# Patient Record
Sex: Male | Born: 1990 | Race: White | Hispanic: No | Marital: Single
Health system: Southern US, Community
[De-identification: ages and names within clinical notes are randomized; demographics above are authoritative.]

---
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
## 2010-08-08 ENCOUNTER — Emergency Department (HOSPITAL_COMMUNITY): Payer: BC Managed Care – PPO

## 2010-08-08 ENCOUNTER — Emergency Department (HOSPITAL_COMMUNITY)
Admission: EM | Admit: 2010-08-08 | Discharge: 2010-08-08 | Disposition: A | Discharge status: Home / Self Care | Payer: BC Managed Care – PPO | Attending: Emergency Medicine | Admitting: Emergency Medicine

## 2010-08-08 DIAGNOSIS — M25539 Pain in unspecified wrist: Secondary | ICD-10-CM | POA: Insufficient documentation

## 2010-08-08 DIAGNOSIS — M79609 Pain in unspecified limb: Secondary | ICD-10-CM | POA: Insufficient documentation

## 2011-08-20 IMAGING — CR DG HAND COMPLETE 3+V*L*
3 series · 3 of 3 positions shown · non-contrast
Comparison: None.

CLINICAL DATA: Fall.  Hand pain.

LEFT HAND - COMPLETE 3+ VIEW

[x hand ap left]
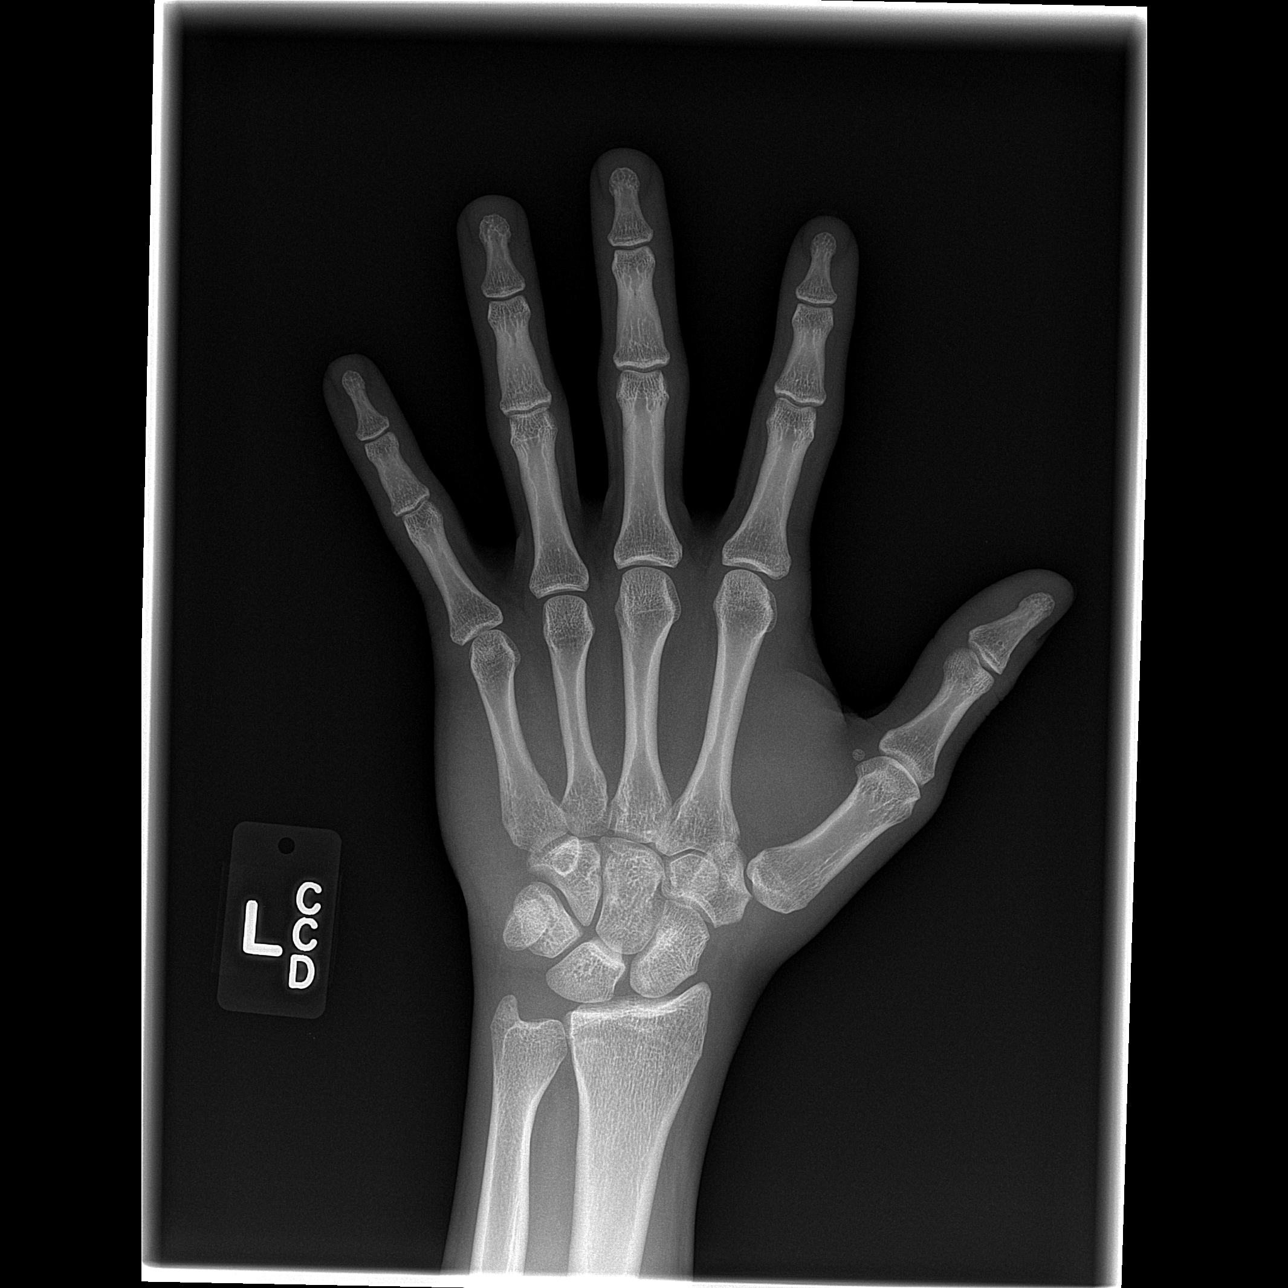

[x hand oblique left]
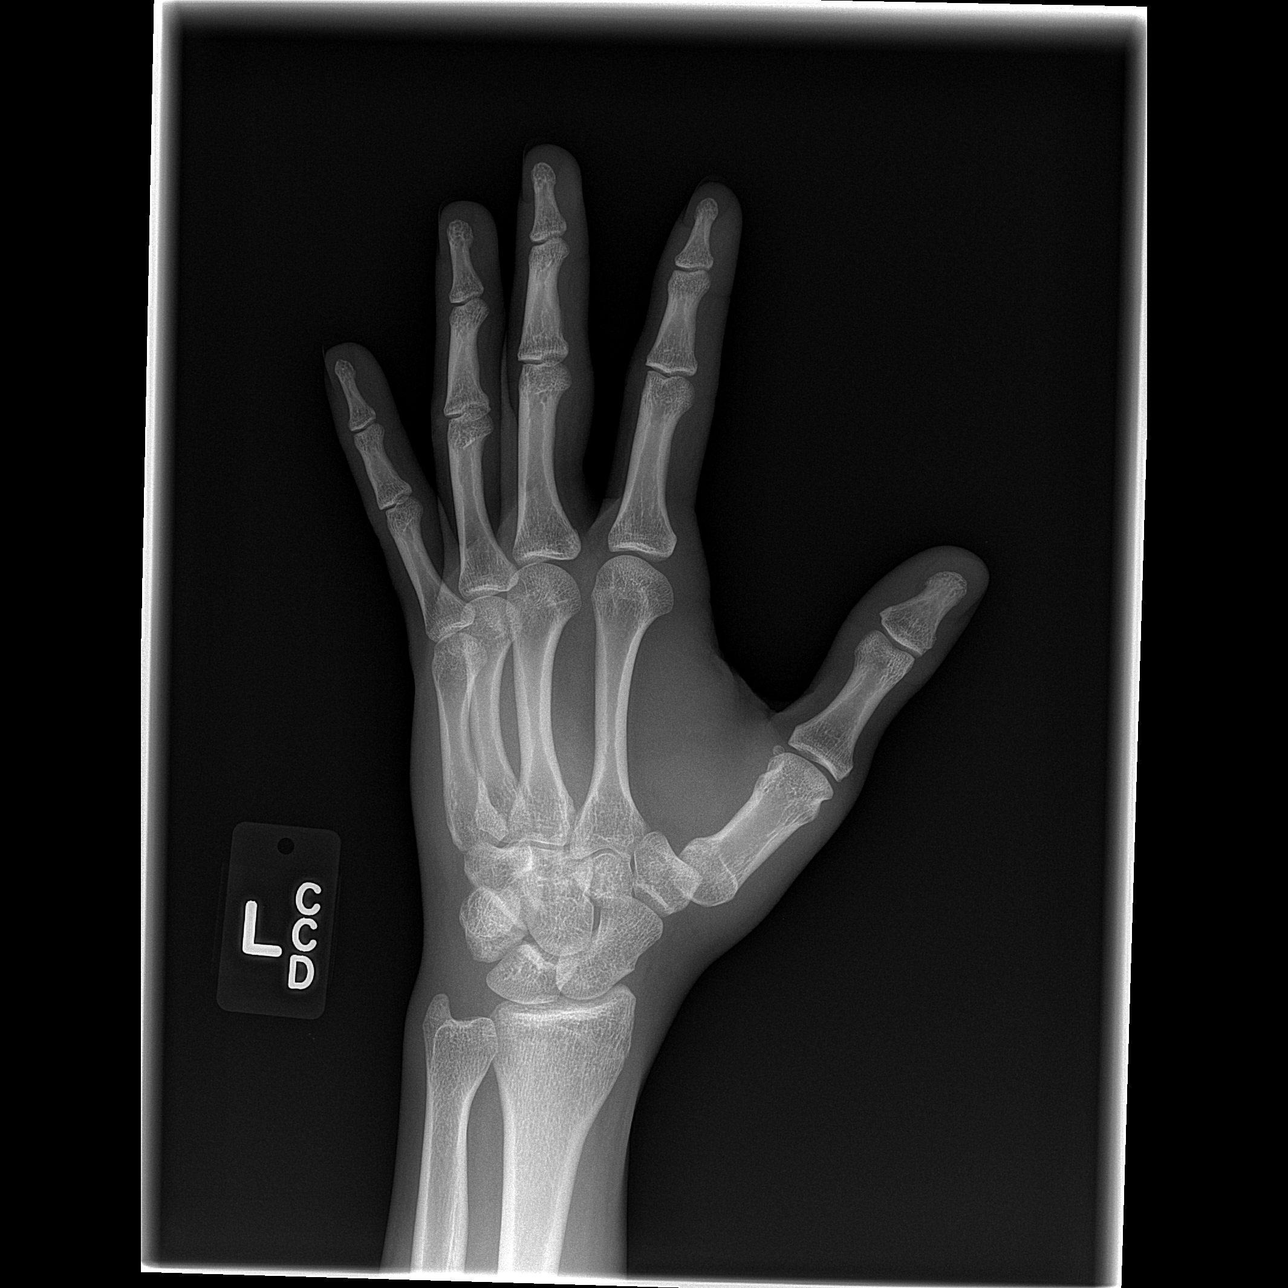

[x hand lat left]
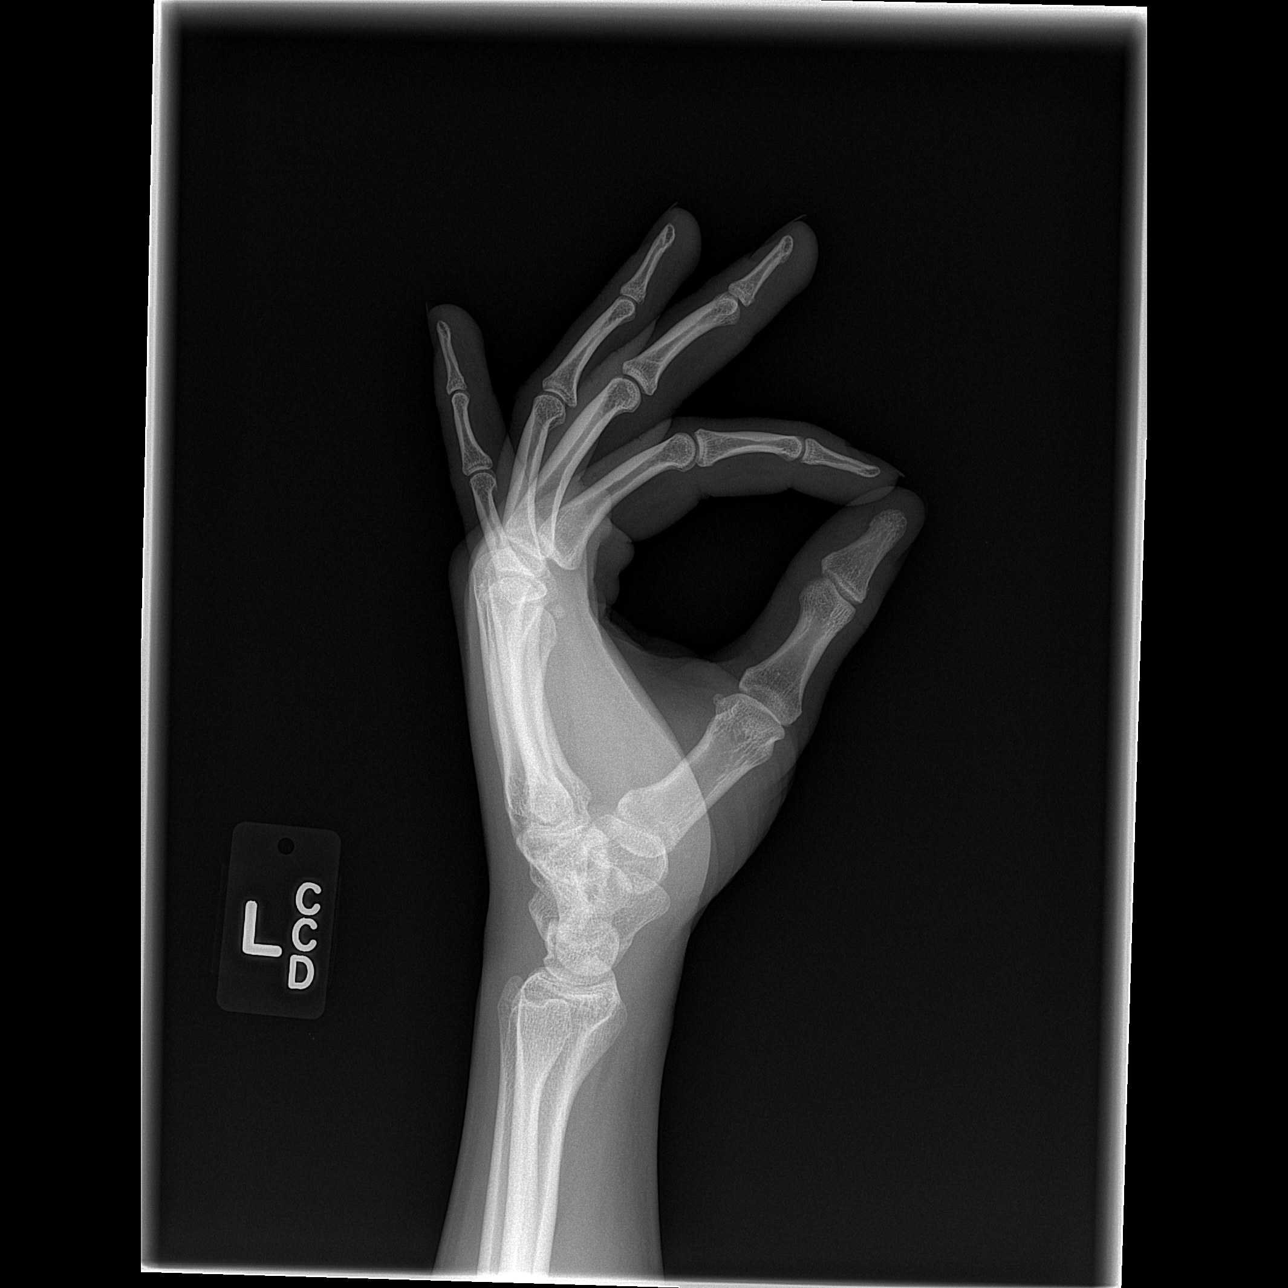

[3 of 3 positions shown; findings below may reference images not displayed]

FINDINGS: No fracture, foreign body, or acute bony findings are
identified.
IMPRESSION: No significant abnormality identified.

## 2013-02-06 ENCOUNTER — Ambulatory Visit: Payer: Self-pay | Admitting: Internal Medicine

## 2014-02-18 IMAGING — CR RIGHT GREAT TOE
1 series · 4 of 4 positions shown · non-contrast
Comparison: none

REASON FOR EXAM: rt great toe and and decreased ROM
COMMENTS:

PROCEDURE:     DXR - DXR TOE GREAT (1ST DIGIT) RT WIN  - February 06, 2013  [DATE]
RESULT:     There is no evidence of fracture, dislocation, or malalignment.

[Series 1: ap · 0.17mm/px · 4 of 4 slices shown]
[im 1/4]
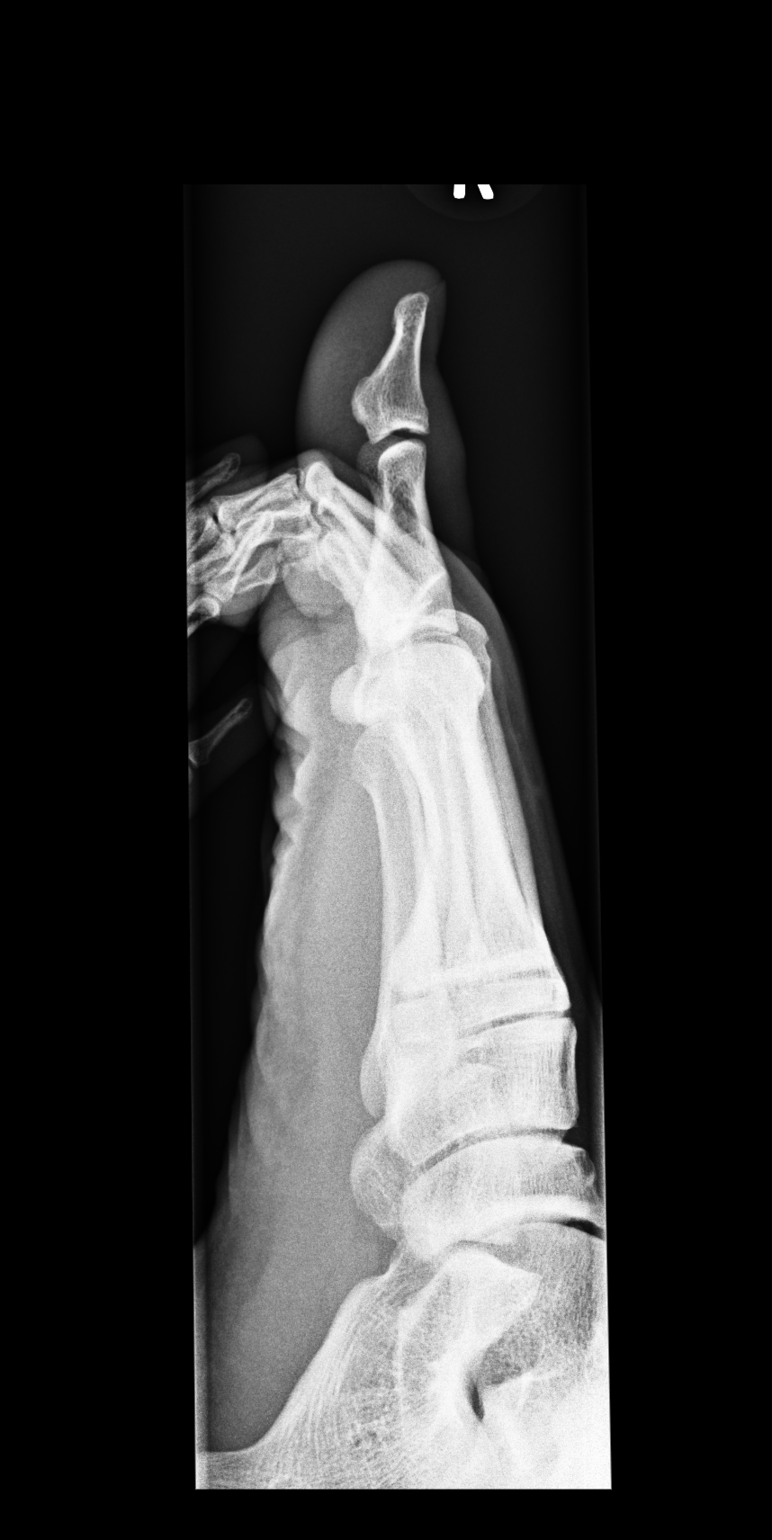
[im 2/4]
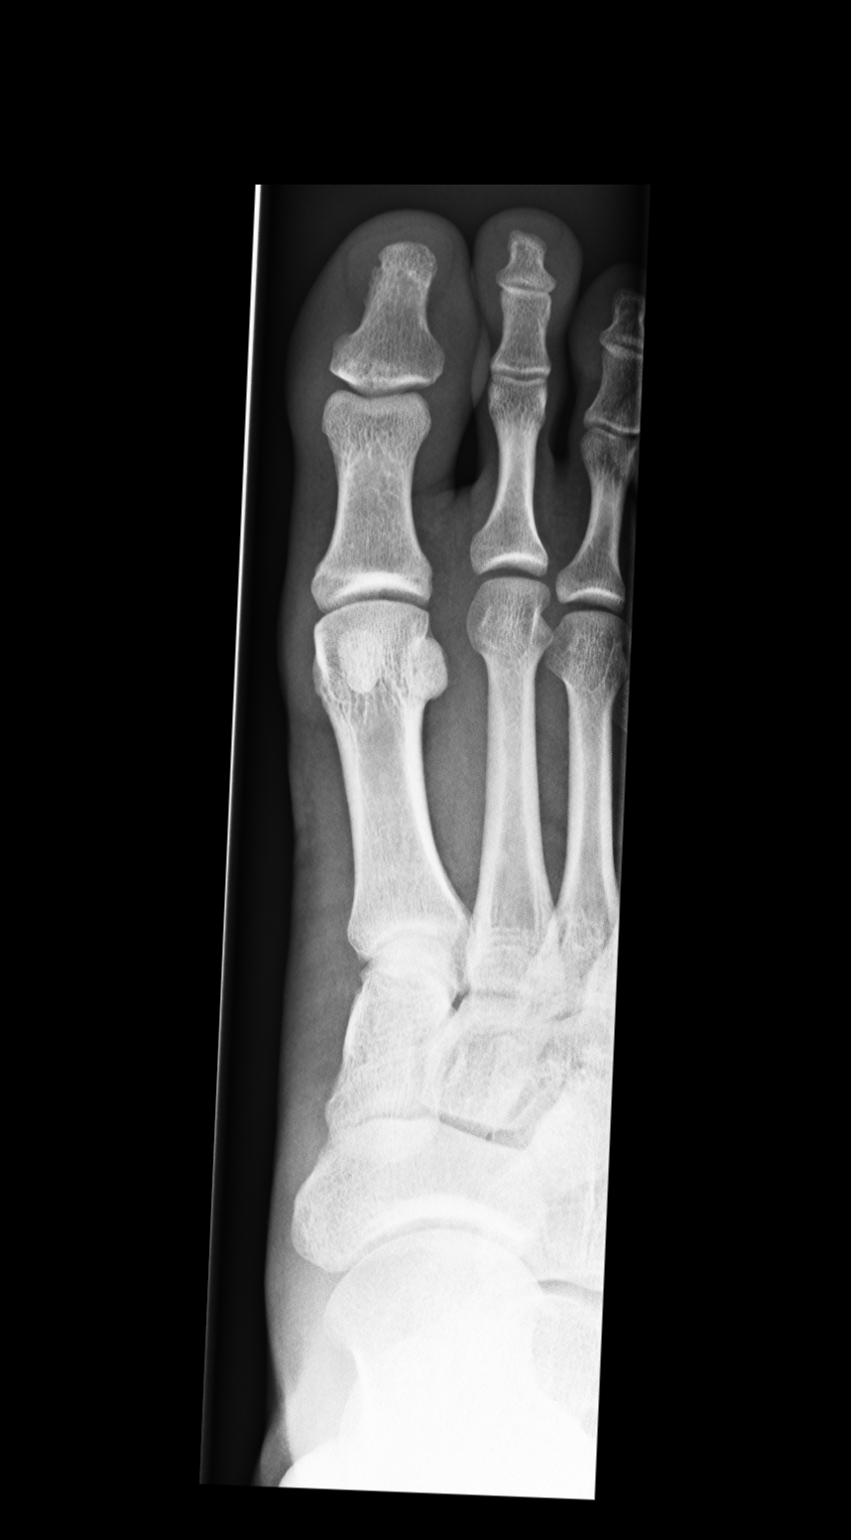
[im 3/4]
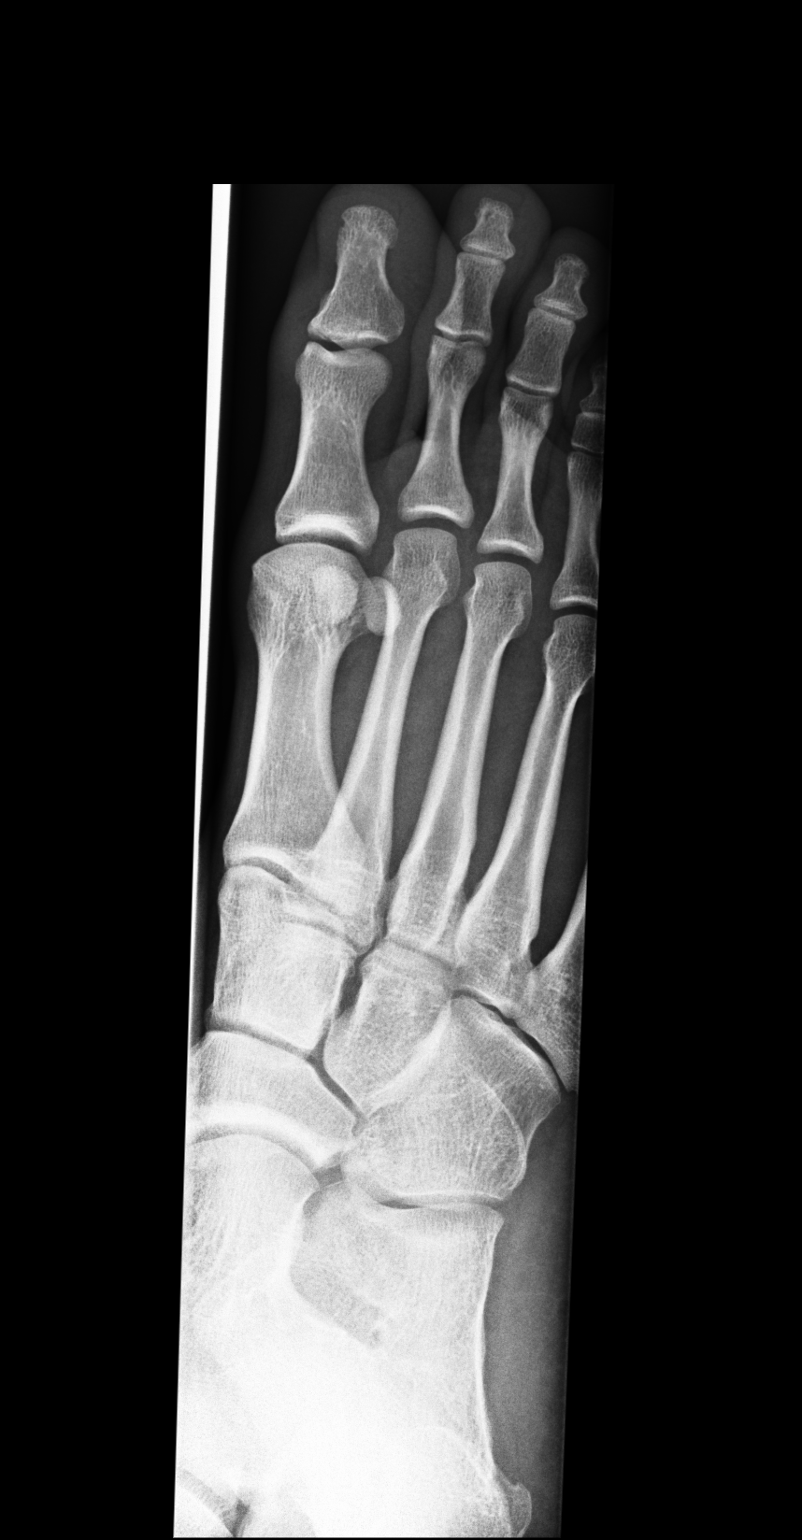
[im 4/4]
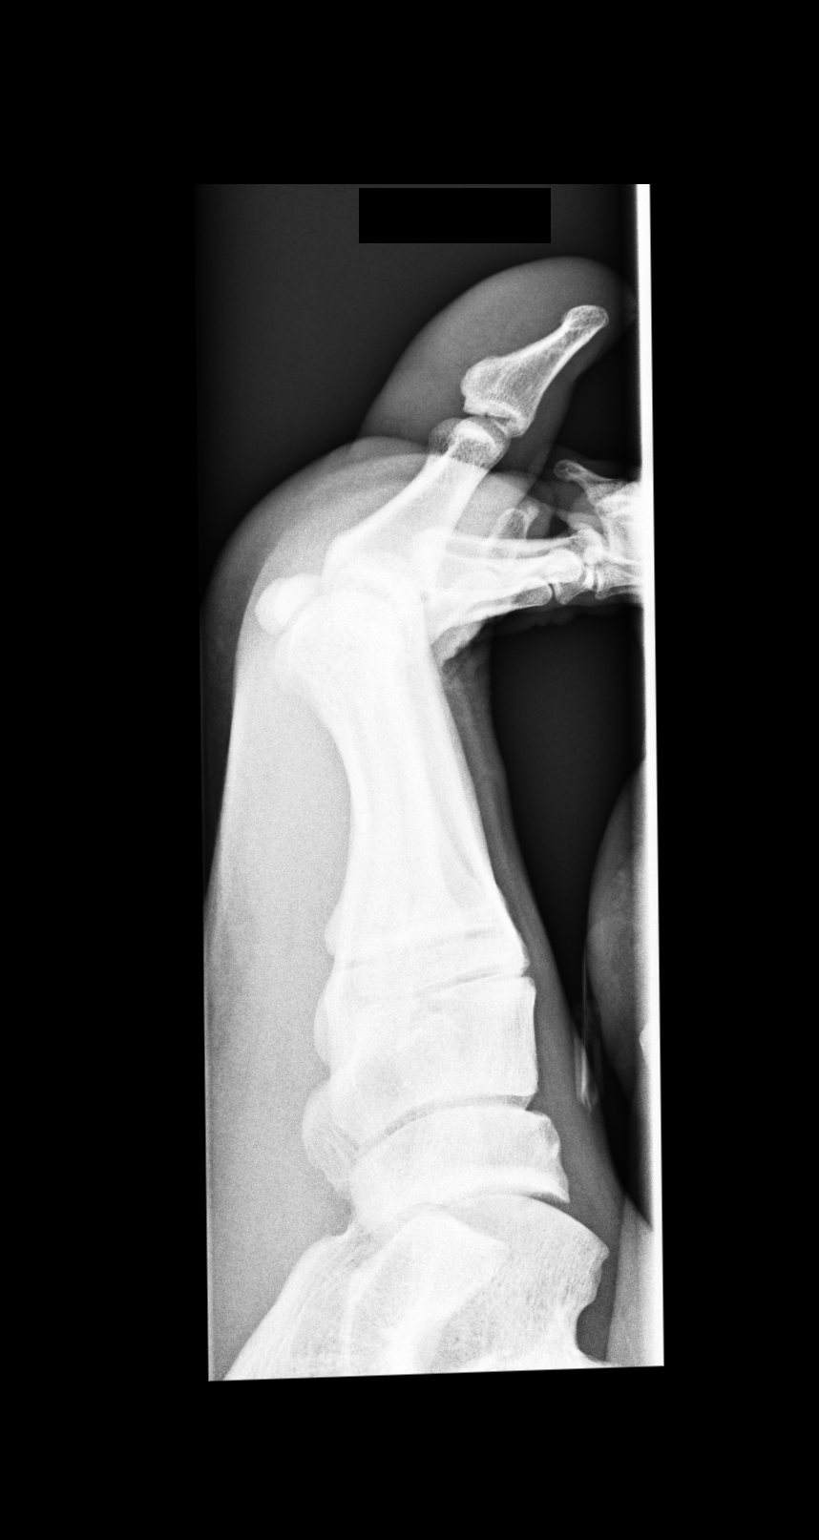

[4 of 4 positions shown; findings below may reference images not displayed]

IMPRESSION: 1. No evidence of acute abnormalities.
2. If there are persistent complaints of pain or persistent clinical
concern, a repeat evaluation in 7-10 days is recommended if clinically
warranted.

## 2019-08-18 ENCOUNTER — Ambulatory Visit: Payer: Self-pay | Attending: Internal Medicine

## 2019-08-19 ENCOUNTER — Ambulatory Visit: Payer: Self-pay | Attending: Internal Medicine

## 2019-08-19 DIAGNOSIS — Z23 Encounter for immunization: Secondary | ICD-10-CM

## 2019-08-19 NOTE — Progress Notes (Signed)
   Covid-[facility] Vaccination Clinic  Name:  Lucas Johnston    MRN: [id number] DOB: 11/03/1990  08/19/2019  [name] was observed post Covid-19 immunization for 15 minutes without incident. He was provided with Vaccine Information Sheet and instruction to access the V-Safe system.   [name] was instructed to call 911 with any severe reactions post vaccine: Marland Kitchen Difficulty breathing  . Swelling of face and throat  . A fast heartbeat  . A bad rash all over body  . Dizziness and weakness   Immunizations Administered    Name Date Dose VIS Date Route   Pfizer COVID-19 Vaccine 08/19/2019  1:22 PM 0.3 mL 05/06/2019 Intramuscular   Manufacturer: [employer], [employer]   Lot: [id number]   NDC: [address]-1

## 2019-09-14 ENCOUNTER — Ambulatory Visit: Payer: Self-pay | Attending: Internal Medicine

## 2019-09-14 DIAGNOSIS — Z23 Encounter for immunization: Secondary | ICD-10-CM

## 2019-09-14 NOTE — Progress Notes (Signed)
   Covid-19 Vaccination Clinic  Name:  Lucas Johnston    MRN: [id number] DOB: 07/06/1990  09/14/2019  Lucas Johnston was observed post Covid-19 immunization for 15 minutes without incident. He was provided with Vaccine Information Sheet and instruction to access the V-Safe system.   Lucas Johnston was instructed to call 911 with any severe reactions post vaccine: Marland Kitchen Difficulty breathing  . Swelling of face and throat  . A fast heartbeat  . A bad rash all over body  . Dizziness and weakness   Immunizations Administered    Name Date Dose VIS Date Route   Pfizer COVID-19 Vaccine 09/14/2019  9:54 AM 0.3 mL 07/20/2018 Intramuscular   Manufacturer: [employer], [employer]   Lot: [id number]   NDC: [address]-7
